# Patient Record
Sex: Female | Born: 1945 | Race: White | Hispanic: No | Smoking: Never smoker
Health system: Southern US, Community
[De-identification: ages and names within clinical notes are randomized; demographics above are authoritative.]

## PROBLEM LIST (undated history)

## (undated) DIAGNOSIS — F32A Depression, unspecified: Secondary | ICD-10-CM

## (undated) DIAGNOSIS — R7303 Prediabetes: Secondary | ICD-10-CM

## (undated) DIAGNOSIS — F329 Major depressive disorder, single episode, unspecified: Secondary | ICD-10-CM

## (undated) HISTORY — PX: OVARIAN CYST REMOVAL: SHX89

## (undated) HISTORY — DX: Depression, unspecified: F32.A

## (undated) HISTORY — DX: Prediabetes: R73.03

## (undated) HISTORY — PX: ABDOMINAL HYSTERECTOMY: SHX81

## (undated) HISTORY — PX: DILATION AND CURETTAGE OF UTERUS: SHX78

## (undated) HISTORY — DX: Major depressive disorder, single episode, unspecified: F32.9

---

## 2005-02-04 ENCOUNTER — Emergency Department (HOSPITAL_COMMUNITY): Admission: EM | Admit: 2005-02-04 | Discharge: 2005-02-04 | Payer: Self-pay | Admitting: Emergency Medicine

## 2005-03-05 ENCOUNTER — Emergency Department (HOSPITAL_COMMUNITY): Admission: EM | Admit: 2005-03-05 | Discharge: 2005-03-05 | Payer: Self-pay | Admitting: Emergency Medicine

## 2005-08-27 ENCOUNTER — Emergency Department (HOSPITAL_COMMUNITY): Admission: EM | Admit: 2005-08-27 | Discharge: 2005-08-28 | Payer: Self-pay | Admitting: Emergency Medicine

## 2006-08-08 ENCOUNTER — Other Ambulatory Visit: Admission: RE | Admit: 2006-08-08 | Discharge: 2006-08-08 | Payer: Self-pay | Admitting: Internal Medicine

## 2006-09-12 ENCOUNTER — Encounter: Admission: RE | Admit: 2006-09-12 | Discharge: 2006-09-12 | Payer: Self-pay | Admitting: Family Medicine

## 2007-11-11 ENCOUNTER — Emergency Department (HOSPITAL_COMMUNITY): Admission: EM | Admit: 2007-11-11 | Discharge: 2007-11-11 | Payer: Self-pay | Admitting: *Deleted

## 2008-05-26 ENCOUNTER — Encounter: Payer: Self-pay | Admitting: Gastroenterology

## 2008-06-03 ENCOUNTER — Telehealth (INDEPENDENT_AMBULATORY_CARE_PROVIDER_SITE_OTHER): Payer: Self-pay | Admitting: *Deleted

## 2008-06-08 ENCOUNTER — Encounter: Payer: Self-pay | Admitting: Gastroenterology

## 2008-06-08 DIAGNOSIS — K6289 Other specified diseases of anus and rectum: Secondary | ICD-10-CM | POA: Insufficient documentation

## 2008-06-15 ENCOUNTER — Ambulatory Visit: Payer: Self-pay | Admitting: Gastroenterology

## 2008-06-23 ENCOUNTER — Encounter: Payer: Self-pay | Admitting: Gastroenterology

## 2008-06-23 ENCOUNTER — Ambulatory Visit (HOSPITAL_COMMUNITY): Admission: RE | Admit: 2008-06-23 | Discharge: 2008-06-23 | Payer: Self-pay | Admitting: Gastroenterology

## 2008-06-23 ENCOUNTER — Ambulatory Visit: Payer: Self-pay | Admitting: Gastroenterology

## 2008-06-26 ENCOUNTER — Encounter: Payer: Self-pay | Admitting: Gastroenterology

## 2008-08-25 ENCOUNTER — Encounter: Admission: RE | Admit: 2008-08-25 | Discharge: 2008-08-25 | Payer: Self-pay | Admitting: Family Medicine

## 2009-07-13 ENCOUNTER — Other Ambulatory Visit: Admission: RE | Admit: 2009-07-13 | Discharge: 2009-07-13 | Payer: Self-pay | Admitting: Family Medicine

## 2009-08-30 ENCOUNTER — Encounter: Admission: RE | Admit: 2009-08-30 | Discharge: 2009-08-30 | Payer: Self-pay | Admitting: Family Medicine

## 2009-09-13 ENCOUNTER — Encounter: Admission: RE | Admit: 2009-09-13 | Discharge: 2009-09-13 | Payer: Self-pay | Admitting: Family Medicine

## 2010-08-07 ENCOUNTER — Emergency Department (HOSPITAL_COMMUNITY): Admission: EM | Admit: 2010-08-07 | Discharge: 2010-08-08 | Payer: Self-pay | Admitting: Emergency Medicine

## 2010-08-08 ENCOUNTER — Ambulatory Visit: Payer: Self-pay | Admitting: Psychiatry

## 2010-08-08 ENCOUNTER — Inpatient Hospital Stay (HOSPITAL_COMMUNITY): Admission: AD | Admit: 2010-08-08 | Discharge: 2010-08-17 | Payer: Self-pay | Admitting: Psychiatry

## 2010-08-21 ENCOUNTER — Other Ambulatory Visit (HOSPITAL_COMMUNITY): Admission: RE | Admit: 2010-08-21 | Discharge: 2010-09-05 | Payer: Self-pay | Admitting: Psychiatry

## 2010-08-30 ENCOUNTER — Ambulatory Visit: Payer: Self-pay | Admitting: Psychiatry

## 2010-09-12 ENCOUNTER — Ambulatory Visit: Payer: Self-pay | Admitting: Psychiatry

## 2010-09-13 ENCOUNTER — Other Ambulatory Visit: Admission: RE | Admit: 2010-09-13 | Discharge: 2010-09-13 | Payer: Self-pay | Admitting: Family Medicine

## 2010-10-04 ENCOUNTER — Encounter
Admission: RE | Admit: 2010-10-04 | Discharge: 2010-10-04 | Payer: Self-pay | Source: Home / Self Care | Admitting: Family Medicine

## 2011-01-09 LAB — BASIC METABOLIC PANEL
CO2: 25 mEq/L (ref 19–32)
Calcium: 9.4 mg/dL (ref 8.4–10.5)
Creatinine, Ser: 0.91 mg/dL (ref 0.4–1.2)
GFR calc Af Amer: >60 mL/min (ref >60)
Glucose, Bld: 114 mg/dL — ABNORMAL HIGH (ref 70–99)

## 2011-01-09 LAB — CBC
MCH: 31.3 pg (ref 26.0–34.0)
MCHC: 34.8 g/dL (ref 30.0–36.0)
Platelets: 248 10*3/uL (ref 150–400)

## 2011-01-09 LAB — DIFFERENTIAL
Basophils Absolute: 0.1 10*3/uL (ref 0.0–0.1)
Basophils Relative: 1 % (ref 0–1)
Eosinophils Absolute: 0.1 10*3/uL (ref 0.0–0.7)
Neutrophils Relative %: 73 % (ref 43–77)

## 2011-01-09 LAB — RAPID URINE DRUG SCREEN, HOSP PERFORMED
Barbiturates: NOT DETECTED
Opiates: NOT DETECTED

## 2011-01-09 LAB — ACETAMINOPHEN LEVEL: Acetaminophen (Tylenol), Serum: <10 ug/mL — ABNORMAL LOW (ref 10–30)

## 2011-07-17 LAB — URINALYSIS, ROUTINE W REFLEX MICROSCOPIC
Glucose, UA: NEGATIVE
Ketones, ur: NEGATIVE
Nitrite: NEGATIVE
Specific Gravity, Urine: 1.022
pH: 7

## 2011-07-17 LAB — ETHANOL: Alcohol, Ethyl (B): 5

## 2011-07-17 LAB — DIFFERENTIAL
Basophils Relative: 1
Lymphocytes Relative: 30
Monocytes Absolute: 0.5

## 2011-07-17 LAB — COMPREHENSIVE METABOLIC PANEL
AST: 19
Albumin: 3.9
CO2: 30
GFR calc non Af Amer: >60
Glucose, Bld: 99
Potassium: 3.7
Sodium: 142
Total Bilirubin: 0.6
Total Protein: 6

## 2011-07-17 LAB — RAPID URINE DRUG SCREEN, HOSP PERFORMED
Barbiturates: NOT DETECTED
Benzodiazepines: NOT DETECTED
Cocaine: NOT DETECTED
Opiates: NOT DETECTED

## 2011-07-17 LAB — CBC
Hemoglobin: 13.4
RBC: 4.3
RDW: 12.1

## 2011-07-17 LAB — TRICYCLICS SCREEN, URINE: TCA Scrn: NOT DETECTED

## 2011-07-17 LAB — TSH: TSH: 1.813

## 2011-07-17 LAB — ACETAMINOPHEN LEVEL: Acetaminophen (Tylenol), Serum: <10 — ABNORMAL LOW

## 2014-02-22 ENCOUNTER — Other Ambulatory Visit: Payer: Self-pay | Admitting: Family Medicine

## 2014-02-22 DIAGNOSIS — Z1231 Encounter for screening mammogram for malignant neoplasm of breast: Secondary | ICD-10-CM

## 2014-03-04 ENCOUNTER — Ambulatory Visit: Payer: Self-pay

## 2014-03-11 ENCOUNTER — Ambulatory Visit
Admission: RE | Admit: 2014-03-11 | Discharge: 2014-03-11 | Disposition: A | Discharge status: Home / Self Care | Payer: Medicare Other | Source: Ambulatory Visit | Attending: Family Medicine | Admitting: Family Medicine

## 2014-03-11 DIAGNOSIS — Z1231 Encounter for screening mammogram for malignant neoplasm of breast: Secondary | ICD-10-CM

## 2015-12-11 ENCOUNTER — Ambulatory Visit (INDEPENDENT_AMBULATORY_CARE_PROVIDER_SITE_OTHER): Payer: Commercial Managed Care - HMO | Admitting: Neurology

## 2015-12-11 ENCOUNTER — Encounter: Payer: Self-pay | Admitting: Neurology

## 2015-12-11 ENCOUNTER — Other Ambulatory Visit (INDEPENDENT_AMBULATORY_CARE_PROVIDER_SITE_OTHER): Payer: Commercial Managed Care - HMO

## 2015-12-11 VITALS — BP 146/82 | HR 71 | Wt 151.0 lb

## 2015-12-11 DIAGNOSIS — G629 Polyneuropathy, unspecified: Secondary | ICD-10-CM

## 2015-12-11 LAB — SEDIMENTATION RATE: SED RATE: 11 mm/h (ref 0–22)

## 2015-12-11 NOTE — Progress Notes (Signed)
NEUROLOGY CONSULTATION NOTE  Diana Velazquez MRN: [id number] DOB: 05/11/1946  Referring provider: Eloise Levels, PA-C Primary care provider: Eloise Levels, PA-C  Reason for consult:  Numbness and tingling in both feet  Thank you for your kind referral of Diana Velazquez for consultation of the above symptoms. Although her history is well known to you, please allow me to reiterate it for the purpose of our medical record. Records and images were personally reviewed where available.  HISTORY OF PRESENT ILLNESS: This is a 70 year old right-handed woman with a history of GERD, depression, presenting for evaluation of paresthesias in both feet that started in September 2016. She describes constant pins and needles and feeling like her she is wearing inflated soles all the time. She denies any pain, no neck or significant back pain. She denies any paresthesias in her hands, but has occasional tremors in her left hand when she is holding something. She has noticed that her balance is off, she loses her balance more than she used to but catches herself, no falls. She has to hold on to things now when she puts on shoes. She was found to have a low B12 level and started B12 injections, however even with normal recent B12 of 652, paresthesias are unchanged. She reports having glucose and thyroid levels done, and was told she is "prediabetic," but tests were normal. Results unavailable for review. She started Effexor before the paresthesias started, and has not noticed any difference with taking medication.   She denies any headaches, dizziness, diplopia, dysarthria, bowel/bladder dysfunction. She had some difficulty swallowing that resolved with Protonix intake. She has been drinking a couple of glasses of wine around 4 nights a week. No family history of similar symptoms.   Laboratory Data: 09/14/15 vitamin B12 652 10/18/15: CBC normal, CMP normal with glucose of 95, LFTs normal.  PAST MEDICAL  HISTORY: Past Medical History  Diagnosis Date  . Pre-diabetes   . Depression     PAST SURGICAL HISTORY: Past Surgical History  Procedure Laterality Date  . Ovarian cyst removal    . Abdominal hysterectomy    . Dilation and curettage of uterus      MEDICATIONS: Effexor XR '150mg'$  daily Protonix '40mg'$  daily   No current facility-administered medications on file prior to visit.    ALLERGIES: No Known Allergies  FAMILY HISTORY: Family History  Problem Relation Age of Onset  . Emphysema Father     SOCIAL HISTORY: Social History   Social History  . Marital Status: Divorced    Spouse Name: N/A  . Number of Children: N/A  . Years of Education: N/A   Occupational History  . Retired    Social History Main Topics  . Smoking status: Never Smoker   . Smokeless tobacco: Never Used  . Alcohol Use: 0.0 oz/week    0 Standard drinks or equivalent per week     Comment: Wine 4 x per week  . Drug Use: No  . Sexual Activity: Not on file   Other Topics Concern  . Not on file   Social History Narrative  . No narrative on file    REVIEW OF SYSTEMS: Constitutional: No fevers, chills, or sweats, no generalized fatigue, change in appetite Eyes: No visual changes, double vision, eye pain Ear, nose and throat: No hearing loss, ear pain, nasal congestion, sore throat Cardiovascular: No chest pain, palpitations Respiratory:  No shortness of breath at rest or with exertion, wheezes GastrointestinaI: No nausea, vomiting, diarrhea, abdominal  pain, fecal incontinence Genitourinary:  No dysuria, urinary retention or frequency Musculoskeletal:  No neck pain, back pain Integumentary: No rash, pruritus, skin lesions Neurological: as above Psychiatric: + depression, no insomnia, anxiety Endocrine: No palpitations, fatigue, diaphoresis, mood swings, change in appetite, change in weight, increased thirst Hematologic/Lymphatic:  No anemia, purpura, petechiae. Allergic/Immunologic: no  itchy/runny eyes, nasal congestion, recent allergic reactions, rashes  PHYSICAL EXAM: Filed Vitals:   12/11/15 1402  BP: 146/82  Pulse: 71   General: No acute distress Head:  Normocephalic/atraumatic Eyes: Fundoscopic exam shows bilateral sharp discs, no vessel changes, exudates, or hemorrhages Neck: supple, no paraspinal tenderness, full range of motion Back: No paraspinal tenderness Heart: regular rate and rhythm Lungs: Clear to auscultation bilaterally. Vascular: No carotid bruits. Skin/Extremities: No rash, no edema Neurological Exam: Mental status: alert and oriented to person, place, and time, no dysarthria or aphasia, Fund of knowledge is appropriate.  Recent and remote memory are intact.  Attention and concentration are normal.    Able to name objects and repeat phrases. Cranial nerves: CN I: not tested CN II: pupils equal, round and reactive to light, visual fields intact, fundi unremarkable. CN III, IV, VI:  full range of motion, no nystagmus, no ptosis CN V: facial sensation intact CN VII: upper and lower face symmetric CN VIII: hearing intact to finger rub CN IX, X: gag intact, uvula midline CN XI: sternocleidomastoid and trapezius muscles intact CN XII: tongue midline Bulk & Tone: normal, no fasciculations. Motor: 5/5 throughout with no pronator drift. Sensation: decreased cold, pin, vibration sense in a stocking distribution to ankles bilaterally, but reports feeling less pin on sole of left foot, dorsum of right foot. Intact to all modalities on both UE, intact joint position sense.  No extinction to double simultaneous stimulation.  Romberg test slight sway Deep Tendon Reflexes: +2 throughout, no ankle clonus Plantar responses: downgoing bilaterally Cerebellar: no incoordination on finger to nose testing Gait: narrow-based and steady, mild difficulty with tandem walk but able Tremor: none in office today  IMPRESSION: This is a 70 year old right-handed woman with  a history of GERD, depression, presenting for evaluation of constant paresthesias in both feet since September 2016. 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 Motor exam normal, no left hand tremor seen today. She initially had low B12 levels, however after B12 injections, most recent level is normal. We discussed different causes of neuropathy, she reports glucose and thyroid levels were normal. Check ESR, ANA, RPR, SPEP/IFE. 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 EMG/NCV of both lower extremities will be ordered to further evaluate her symptoms. We discussed the option of starting symptomatic treatment such as gabapentin, she would like to hold off for now. She will follow-up in 7 months and knows to call for any changes.   Thank you for allowing me to participate in the care of this patient. Please do not hesitate to call for any questions or concerns.   Ellouise Newer, M.D.  CC: Eloise Levels, PA-C

## 2015-12-11 NOTE — Patient Instructions (Signed)
1. Bloodwork for ESR, ANA, RPR, SPEP/IFE 2. Schedule EMG/NCV of both LE with Dr. Posey Pronto 3. Recommend reducing alcohol intake 4. If symptoms become more bothersome, call our office and we can start symptomatic treatment 5. Follow-up in 7 months, call for any changes

## 2015-12-12 LAB — ANA: ANA: NEGATIVE

## 2015-12-12 LAB — RPR

## 2015-12-12 IMAGING — MG MM DIGITAL SCREENING BILAT W/ CAD
4 series · 4 of 4 positions shown · non-contrast
Comparison: 10/04/2010, 08/30/2009, 08/25/2008.

CLINICAL DATA: Screening.

EXAM:
DIGITAL SCREENING BILATERAL MAMMOGRAM WITH CAD

[R CC]
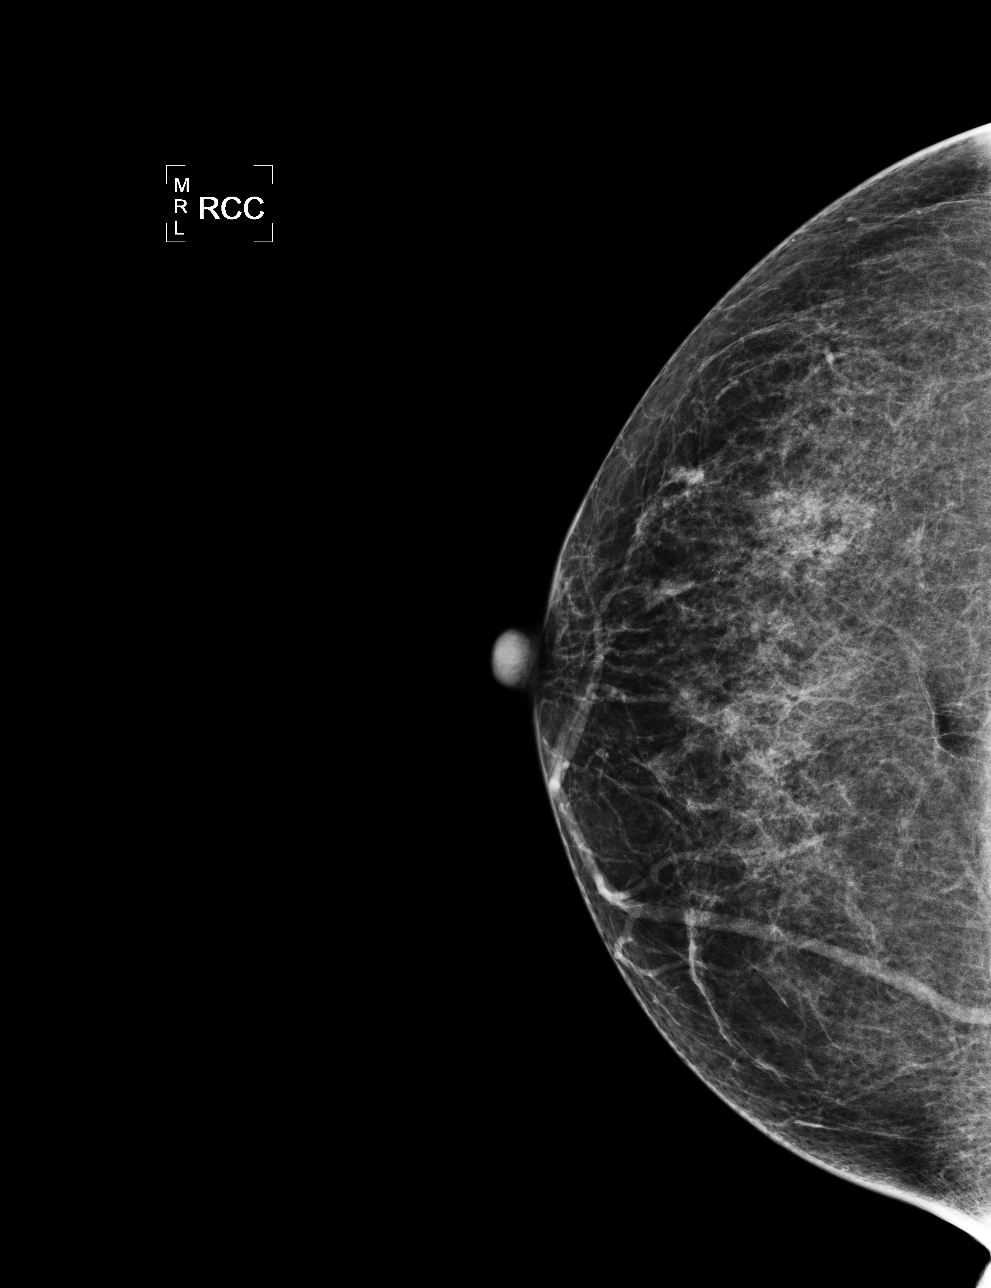

[L CC]
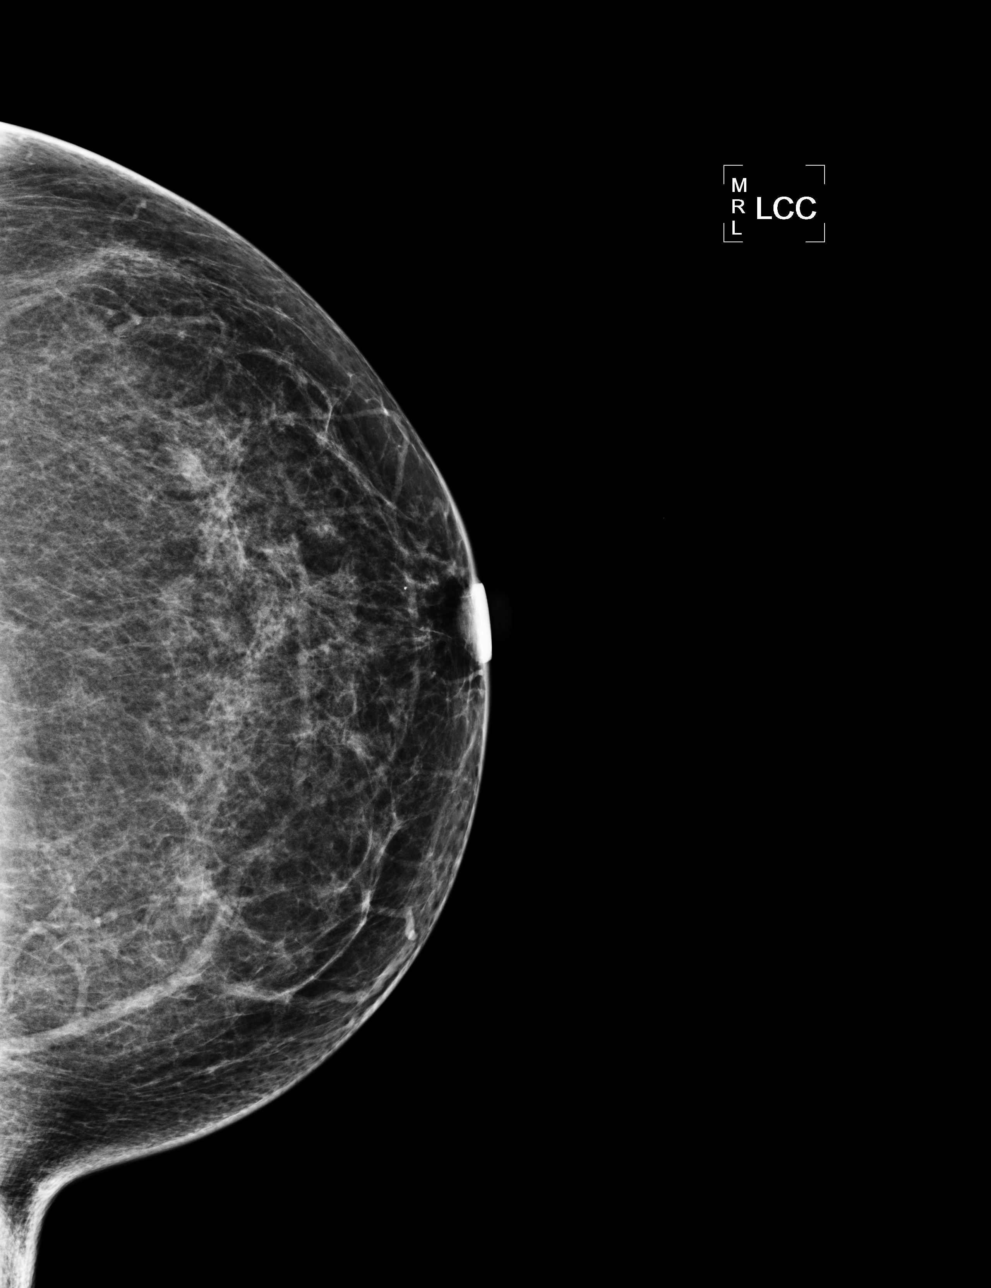

[L MLO]
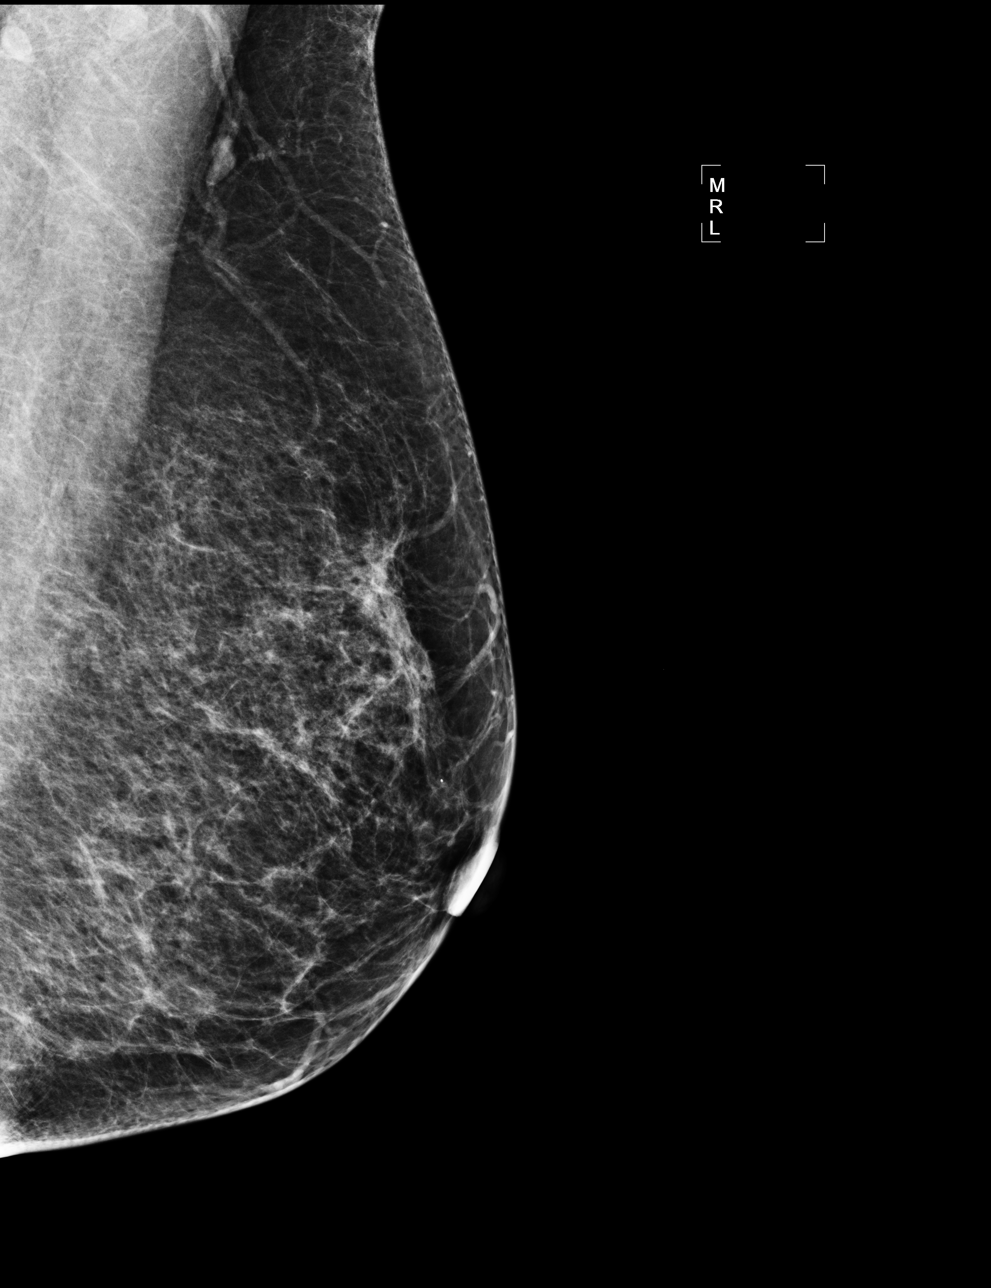

[R MLO]
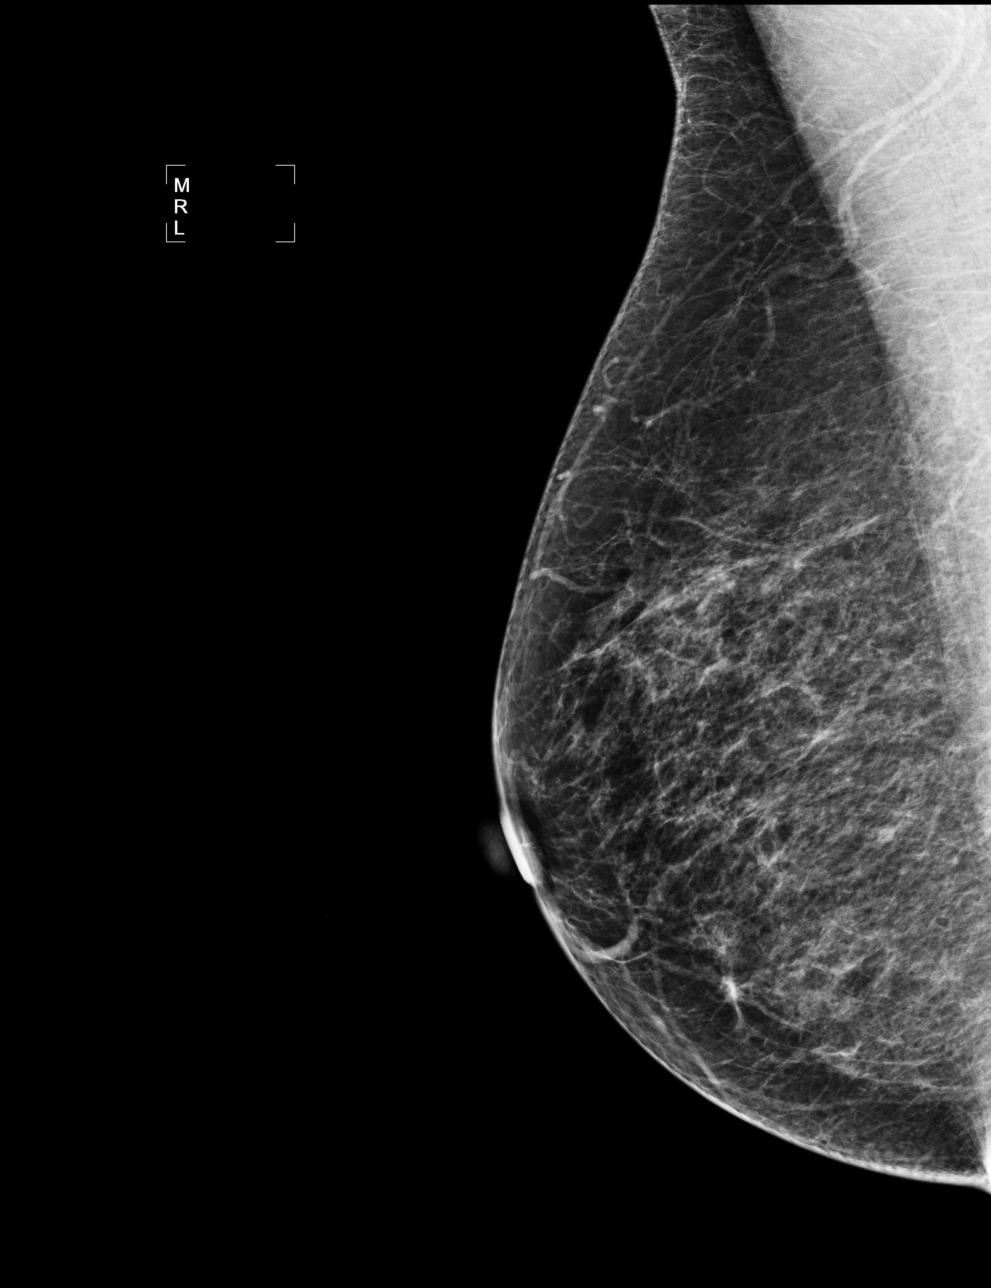

[4 of 4 positions shown; findings below may reference images not displayed]

ACR Breast Density Category b: There are scattered areas of
fibroglandular density.
FINDINGS: There are no findings suspicious for malignancy. Images were
processed with CAD.
IMPRESSION: No mammographic evidence of malignancy. A result letter of this
screening mammogram will be mailed directly to the patient.

RECOMMENDATION:
Screening mammogram in one year. (Code:[id number]-[id number])

BI-RADS CATEGORY  1: Negative.

## 2015-12-13 LAB — PROTEIN ELECTROPHORESIS, SERUM
ALPHA-2-GLOBULIN: 0.7 g/dL (ref 0.5–0.9)
Albumin ELP: 4.2 g/dL (ref 3.8–4.8)
Alpha-1-Globulin: 0.3 g/dL (ref 0.2–0.3)
Beta 2: 0.3 g/dL (ref 0.2–0.5)
Beta Globulin: 0.4 g/dL (ref 0.4–0.6)
GAMMA GLOBULIN: 0.8 g/dL (ref 0.8–1.7)
TOTAL PROTEIN, SERUM ELECTROPHOR: 6.7 g/dL (ref 6.1–8.1)

## 2015-12-13 LAB — IMMUNOFIXATION ELECTROPHORESIS
IGA: 195 mg/dL (ref 69–380)
IGG (IMMUNOGLOBIN G), SERUM: 771 mg/dL (ref 690–1700)
IGM, SERUM: 99 mg/dL (ref 52–322)

## 2015-12-14 ENCOUNTER — Telehealth: Payer: Self-pay | Admitting: Family Medicine

## 2015-12-14 NOTE — Telephone Encounter (Signed)
PT returned your call/Dawn CB#[phone]

## 2015-12-14 NOTE — Telephone Encounter (Signed)
-----   Message from Van Clines, MD sent at 12/13/2015  5:05 PM EST ----- Pls let her know bloodwork is normal, no evidence of inflammation. Thanks

## 2015-12-14 NOTE — Telephone Encounter (Signed)
Returned patients call. I notified her of results.

## 2015-12-14 NOTE — Telephone Encounter (Signed)
Lmovm to rtn my call. 

## 2015-12-26 ENCOUNTER — Encounter: Payer: Self-pay | Admitting: Neurology

## 2016-07-12 ENCOUNTER — Ambulatory Visit: Payer: Self-pay | Admitting: Neurology
